# Patient Record
Sex: Male | Born: 1975 | Hispanic: Yes | Marital: Married
Health system: Southern US, Community
[De-identification: ages and names within clinical notes are randomized; demographics above are authoritative.]

## PROBLEM LIST (undated history)

## (undated) DIAGNOSIS — N200 Calculus of kidney: Secondary | ICD-10-CM

---

## 1998-12-18 ENCOUNTER — Emergency Department (HOSPITAL_COMMUNITY): Admission: EM | Admit: 1998-12-18 | Discharge: 1998-12-18 | Payer: Self-pay | Admitting: Emergency Medicine

## 2009-12-07 ENCOUNTER — Encounter: Admission: RE | Admit: 2009-12-07 | Discharge: 2009-12-07 | Payer: Self-pay | Admitting: Specialist

## 2010-08-23 ENCOUNTER — Other Ambulatory Visit (HOSPITAL_COMMUNITY): Payer: Self-pay | Admitting: Orthopedic Surgery

## 2010-08-23 DIAGNOSIS — M545 Low back pain: Secondary | ICD-10-CM

## 2010-08-24 ENCOUNTER — Ambulatory Visit: Payer: Self-pay | Attending: Orthopedic Surgery | Admitting: Physical Therapy

## 2010-08-24 DIAGNOSIS — M2569 Stiffness of other specified joint, not elsewhere classified: Secondary | ICD-10-CM | POA: Insufficient documentation

## 2010-08-24 DIAGNOSIS — IMO0001 Reserved for inherently not codable concepts without codable children: Secondary | ICD-10-CM | POA: Insufficient documentation

## 2010-08-24 DIAGNOSIS — M545 Low back pain, unspecified: Secondary | ICD-10-CM | POA: Insufficient documentation

## 2010-08-25 ENCOUNTER — Ambulatory Visit (HOSPITAL_COMMUNITY)
Admission: RE | Admit: 2010-08-25 | Discharge: 2010-08-25 | Disposition: A | Discharge status: Home / Self Care | Payer: Self-pay | Source: Ambulatory Visit | Attending: Orthopedic Surgery | Admitting: Orthopedic Surgery

## 2010-08-25 DIAGNOSIS — M5126 Other intervertebral disc displacement, lumbar region: Secondary | ICD-10-CM | POA: Insufficient documentation

## 2010-08-25 DIAGNOSIS — M545 Low back pain, unspecified: Secondary | ICD-10-CM | POA: Insufficient documentation

## 2010-08-25 DIAGNOSIS — M79609 Pain in unspecified limb: Secondary | ICD-10-CM | POA: Insufficient documentation

## 2010-08-31 ENCOUNTER — Ambulatory Visit: Payer: Self-pay | Admitting: Physical Therapy

## 2010-09-03 ENCOUNTER — Emergency Department (HOSPITAL_COMMUNITY)
Admission: EM | Admit: 2010-09-03 | Discharge: 2010-09-03 | Disposition: A | Discharge status: Home / Self Care | Payer: Self-pay | Attending: Emergency Medicine | Admitting: Emergency Medicine

## 2010-09-03 DIAGNOSIS — M545 Low back pain, unspecified: Secondary | ICD-10-CM | POA: Insufficient documentation

## 2010-09-12 ENCOUNTER — Other Ambulatory Visit (HOSPITAL_COMMUNITY): Payer: Self-pay | Admitting: Orthopedic Surgery

## 2010-09-12 ENCOUNTER — Ambulatory Visit (HOSPITAL_COMMUNITY)
Admission: RE | Admit: 2010-09-12 | Discharge: 2010-09-12 | Disposition: A | Discharge status: Home / Self Care | Payer: Self-pay | Source: Ambulatory Visit | Attending: Orthopedic Surgery | Admitting: Orthopedic Surgery

## 2010-09-12 ENCOUNTER — Encounter (HOSPITAL_COMMUNITY)
Admission: RE | Admit: 2010-09-12 | Discharge: 2010-09-12 | Disposition: A | Discharge status: Home / Self Care | Payer: Self-pay | Source: Ambulatory Visit | Attending: Orthopedic Surgery | Admitting: Orthopedic Surgery

## 2010-09-12 DIAGNOSIS — Z01818 Encounter for other preprocedural examination: Secondary | ICD-10-CM | POA: Insufficient documentation

## 2010-09-12 DIAGNOSIS — M545 Low back pain, unspecified: Secondary | ICD-10-CM | POA: Insufficient documentation

## 2010-09-12 DIAGNOSIS — Z01811 Encounter for preprocedural respiratory examination: Secondary | ICD-10-CM | POA: Insufficient documentation

## 2010-09-12 DIAGNOSIS — I10 Essential (primary) hypertension: Secondary | ICD-10-CM | POA: Insufficient documentation

## 2010-09-12 DIAGNOSIS — Z01812 Encounter for preprocedural laboratory examination: Secondary | ICD-10-CM | POA: Insufficient documentation

## 2010-09-12 LAB — CBC
HCT: 41.4 % (ref 39.0–52.0)
MCHC: 34.8 g/dL (ref 30.0–36.0)
RBC: 4.77 MIL/uL (ref 4.22–5.81)
RDW: 12.8 % (ref 11.5–15.5)
WBC: 5.8 10*3/uL (ref 4.0–10.5)

## 2010-09-12 LAB — SURGICAL PCR SCREEN: MRSA, PCR: NEGATIVE

## 2010-09-13 LAB — DIFFERENTIAL
Basophils Relative: 1 % (ref 0–1)
Eosinophils Absolute: 0.4 10*3/uL (ref 0.0–0.7)
Lymphs Abs: 1.6 10*3/uL (ref 0.7–4.0)
Monocytes Absolute: 0.5 10*3/uL (ref 0.1–1.0)
Monocytes Relative: 9 % (ref 3–12)
Neutrophils Relative %: 53 % (ref 43–77)

## 2010-09-13 LAB — COMPREHENSIVE METABOLIC PANEL
Albumin: 4.3 g/dL (ref 3.5–5.2)
Calcium: 10.1 mg/dL (ref 8.4–10.5)
Creatinine, Ser: 0.66 mg/dL (ref 0.4–1.5)
GFR calc non Af Amer: >60 mL/min (ref >60)
Total Bilirubin: 0.2 mg/dL — ABNORMAL LOW (ref 0.3–1.2)

## 2010-09-14 ENCOUNTER — Ambulatory Visit (HOSPITAL_COMMUNITY)
Admission: RE | Admit: 2010-09-14 | Discharge: 2010-09-14 | Disposition: A | Discharge status: Home / Self Care | Payer: Self-pay | Source: Ambulatory Visit | Attending: Orthopedic Surgery | Admitting: Orthopedic Surgery

## 2010-09-14 ENCOUNTER — Ambulatory Visit (HOSPITAL_COMMUNITY): Payer: Self-pay

## 2010-09-14 DIAGNOSIS — Z01812 Encounter for preprocedural laboratory examination: Secondary | ICD-10-CM | POA: Insufficient documentation

## 2010-09-14 DIAGNOSIS — Z01818 Encounter for other preprocedural examination: Secondary | ICD-10-CM | POA: Insufficient documentation

## 2010-09-14 DIAGNOSIS — M5126 Other intervertebral disc displacement, lumbar region: Secondary | ICD-10-CM | POA: Insufficient documentation

## 2010-09-14 DIAGNOSIS — Z01810 Encounter for preprocedural cardiovascular examination: Secondary | ICD-10-CM | POA: Insufficient documentation

## 2010-09-14 LAB — URINALYSIS, ROUTINE W REFLEX MICROSCOPIC
Bilirubin Urine: NEGATIVE
Hgb urine dipstick: NEGATIVE
Ketones, ur: NEGATIVE mg/dL
Nitrite: NEGATIVE
Urobilinogen, UA: 0.2 mg/dL (ref 0.0–1.0)

## 2010-09-14 LAB — TYPE AND SCREEN
ABO/RH(D): O POS
Antibody Screen: NEGATIVE

## 2010-09-14 LAB — ABO/RH: ABO/RH(D): O POS

## 2010-09-14 LAB — PROTIME-INR
INR: 0.97 (ref 0.00–1.49)
Prothrombin Time: 13.1 seconds (ref 11.6–15.2)

## 2010-09-15 NOTE — Op Note (Signed)
Noah Tran, Noah Tran        ACCOUNT NO.:  [id number]  MEDICAL RECORD NO.:  [id number]           PATIENT TYPE:  O  LOCATION:  SDSC                         FACILITY:  [facility]  PHYSICIAN:  Estill Bamberg, MD      DATE OF BIRTH:  07-16-1975  DATE OF PROCEDURE:  09/14/2010 DATE OF DISCHARGE:  09/14/2010                              OPERATIVE REPORT   PREOPERATIVE DIAGNOSES: 1. Left-sided S1 radiculopathy. 2. Left-sided L5-S1 disk herniation.  POSTOPERATIVE DIAGNOSES: 1. Left-sided S1 radiculopathy. 2. Left-sided L5-S1 disk herniation.  PROCEDURE:  Left-sided L5-S1 microdiskectomy.  SURGEON:  Estill Bamberg, MD  ASSISTANT:  None.  ANESTHESIA:  General endotracheal anesthesia.  COMPLICATIONS:  None.  DISPOSITION:  Stable.  INDICATIONS FOR PROCEDURE:  Briefly, Noah Tran is a pleasant 35- year-old male who presented to my office initially on August 19, 2010, with severe debilitating pain in the left leg.  I did ultimately obtain an MRI which was consistent with a large L5-S1 disk herniation causing obvious compression of the traversing S1 nerve.  I therefore had discussion regarding going forward with a L5-S1 microdiskectomy.  The patient did understand the risks and limitations of the procedure as outlined in my preoperative note.  OPERATIVE DETAILS:  On September 14, 2010, the patient brought to surgery and general endotracheal anesthesia was administered.  The patient was given Ancef for antibiotic prophylaxis.  The patient was placed prone on a well-padded Jackson spinal frame.  Antibiotics were given and a time-out procedure was performed.  The back was prepped and draped in the usual sterile fashion and then I did place two 18-gauge spinal needles over the midline to help optimize the location of my incision.  A lateral radiograph was obtained.  I then made a 1-inch incision overlying the L5- S1 interspace.  I then used electrocautery dissection that carried  me through the subcutaneous tissue and I then made a curvilinear incision on the left side of the fascia.  The paraspinal musculature was swept laterally.  I then obtained a second intraoperative lateral radiograph to confirm the appropriate level.  I then subperiosteally exposed the lamina of L5 and S1 and a laminotomy was performed in the usual manner. The ligamentum flavum was removed.  I readily identified the dura and the traversing S1 nerve.  The traversing S1 nerve was swept medially and a large underlying L5-S1 disk herniation was readily noted.  I did use a Penfield four to sweep where the superficial layers overlying the disk herniation and the herniated fragment did readily to clear itself.  It was removed using a pituitary in one large piece.  After the disk was removed, there was noted to be no compression of the traversing S1 nerve.  There was no extravasation of cerebrospinal fluid noted throughout the procedure.  All of the dural bleeding was meticulously controlled with the termination of the procedure using bipolar electrocautery and Gelfoam.  The wound was then copiously irrigated. Depo-Medrol 40 mg was infiltrated about the S1 nerve.  The fascia was then closed using #1 Vicryl.  The subcutaneous layer was closed using 2- 0 Vicryl and skin was closed using 3-0 Monocryl.  Steri-Strips and  Benzoin were applied.  The patient was awoken from general endotracheal anesthesia and transferred to recovery in stable condition.  Of note, all instrument counts were correct at the termination of the procedure.     Estill Bamberg, MD     MD/MEDQ  D:  09/14/2010  T:  09/15/2010  Job:  [id number]  cc:   Dr. Quintella Reichert  Electronically Signed by Loraine Leriche [place] Petion  on 09/15/2010 04:39:34 PM

## 2010-10-12 ENCOUNTER — Ambulatory Visit: Payer: Self-pay | Admitting: Physical Therapy

## 2010-10-12 ENCOUNTER — Ambulatory Visit: Payer: Self-pay | Attending: Orthopedic Surgery | Admitting: Physical Therapy

## 2010-10-12 DIAGNOSIS — M545 Low back pain, unspecified: Secondary | ICD-10-CM | POA: Insufficient documentation

## 2010-10-12 DIAGNOSIS — IMO0001 Reserved for inherently not codable concepts without codable children: Secondary | ICD-10-CM | POA: Insufficient documentation

## 2010-10-12 DIAGNOSIS — M2569 Stiffness of other specified joint, not elsewhere classified: Secondary | ICD-10-CM | POA: Insufficient documentation

## 2010-10-14 ENCOUNTER — Ambulatory Visit: Payer: Self-pay | Admitting: Physical Therapy

## 2010-10-18 ENCOUNTER — Ambulatory Visit: Payer: Self-pay | Admitting: Physical Therapy

## 2010-10-20 ENCOUNTER — Ambulatory Visit: Payer: Self-pay | Admitting: Physical Therapy

## 2010-10-26 ENCOUNTER — Ambulatory Visit: Payer: Self-pay | Admitting: Physical Therapy

## 2010-10-28 ENCOUNTER — Ambulatory Visit: Payer: Self-pay | Admitting: Physical Therapy

## 2010-10-31 ENCOUNTER — Ambulatory Visit: Payer: Self-pay | Admitting: Physical Therapy

## 2010-11-30 ENCOUNTER — Ambulatory Visit: Payer: Self-pay | Attending: Orthopedic Surgery | Admitting: Physical Therapy

## 2010-11-30 DIAGNOSIS — M2569 Stiffness of other specified joint, not elsewhere classified: Secondary | ICD-10-CM | POA: Insufficient documentation

## 2010-11-30 DIAGNOSIS — M545 Low back pain, unspecified: Secondary | ICD-10-CM | POA: Insufficient documentation

## 2010-11-30 DIAGNOSIS — IMO0001 Reserved for inherently not codable concepts without codable children: Secondary | ICD-10-CM | POA: Insufficient documentation

## 2010-12-05 ENCOUNTER — Ambulatory Visit: Payer: Self-pay | Admitting: Physical Therapy

## 2010-12-09 ENCOUNTER — Ambulatory Visit: Payer: Self-pay | Admitting: Physical Therapy

## 2010-12-13 ENCOUNTER — Ambulatory Visit: Payer: Self-pay | Attending: Orthopedic Surgery | Admitting: Physical Therapy

## 2010-12-13 DIAGNOSIS — M2569 Stiffness of other specified joint, not elsewhere classified: Secondary | ICD-10-CM | POA: Insufficient documentation

## 2010-12-13 DIAGNOSIS — M545 Low back pain, unspecified: Secondary | ICD-10-CM | POA: Insufficient documentation

## 2010-12-13 DIAGNOSIS — IMO0001 Reserved for inherently not codable concepts without codable children: Secondary | ICD-10-CM | POA: Insufficient documentation

## 2010-12-19 ENCOUNTER — Ambulatory Visit: Payer: Self-pay | Admitting: Physical Therapy

## 2010-12-23 ENCOUNTER — Ambulatory Visit: Payer: Self-pay | Admitting: Physical Therapy

## 2010-12-28 ENCOUNTER — Ambulatory Visit: Payer: Self-pay | Admitting: Physical Therapy

## 2011-11-30 IMAGING — CR DG CHEST 2V
2 series · 2 of 2 positions shown · non-contrast
Comparison: None.

CLINICAL DATA: Preoperative respiratory exam; hypertension, low
back pain for discectomy

CHEST - 2 VIEW

[view not recorded (1 of 2)]
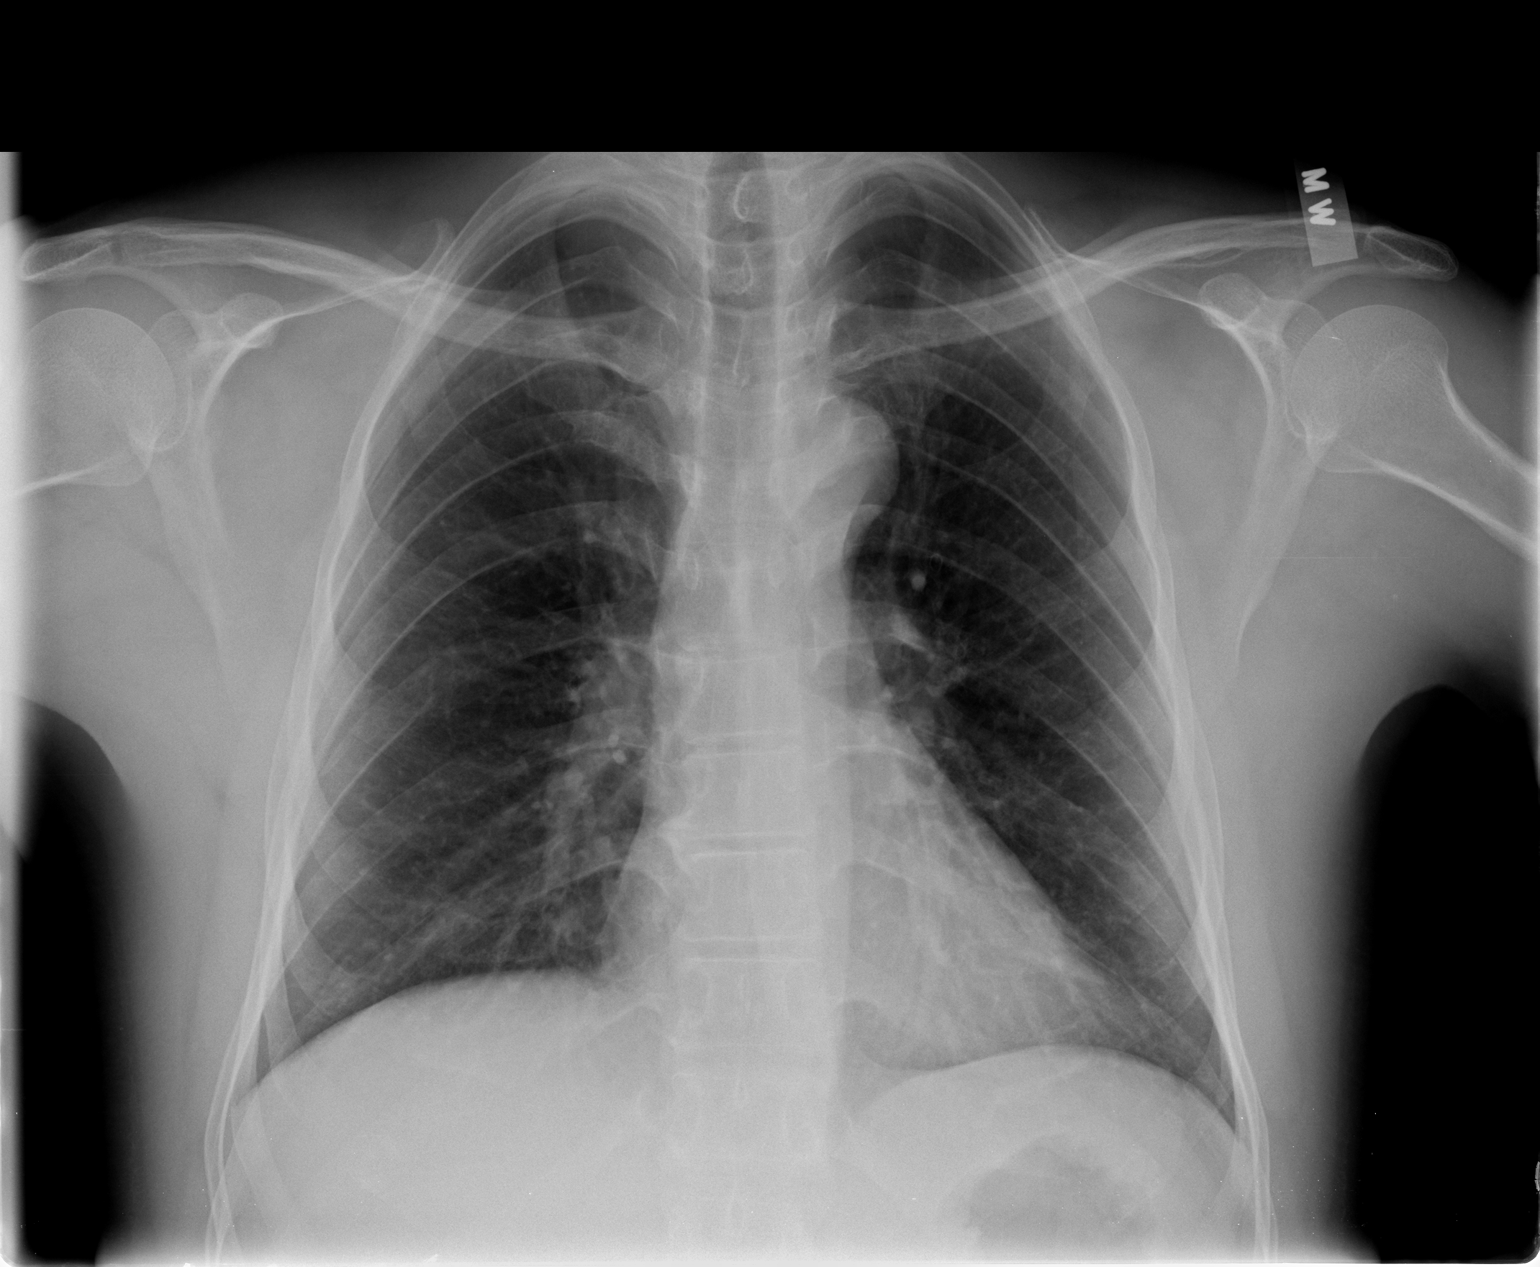

[view not recorded (2 of 2)]
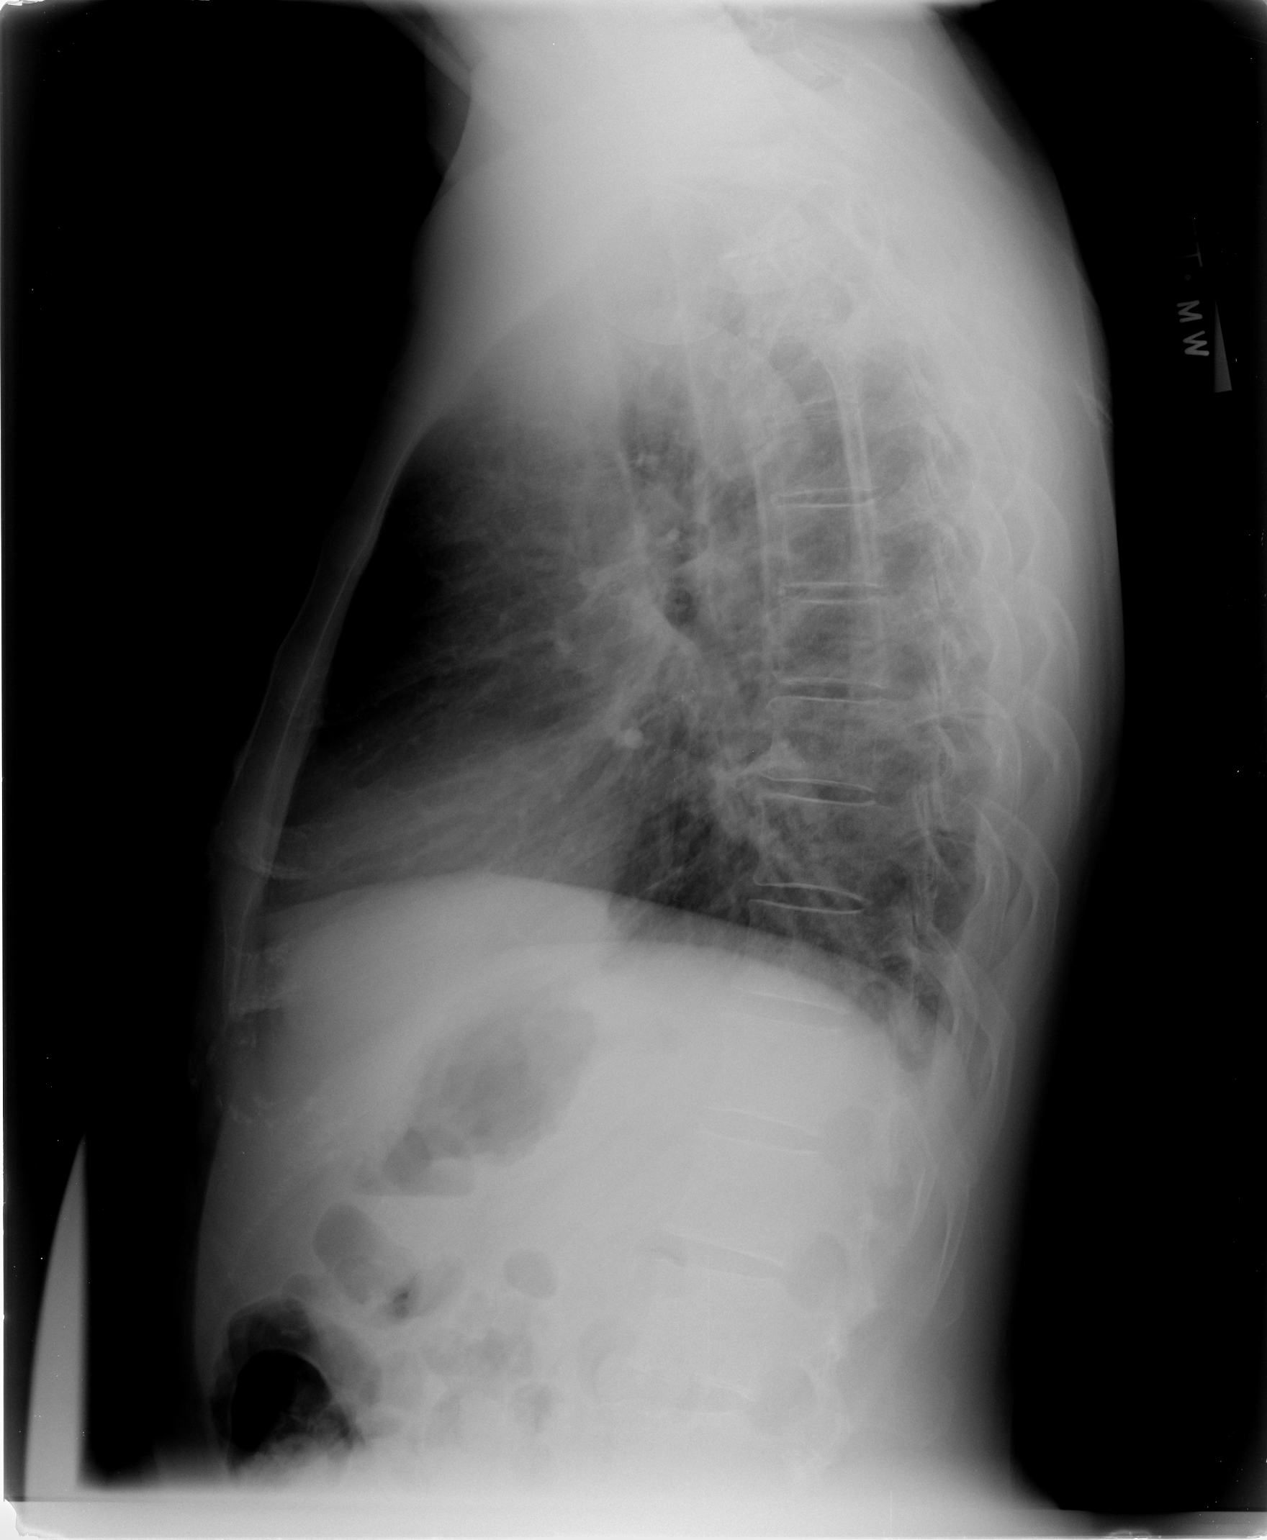

[2 of 2 positions shown; findings below may reference images not displayed]

FINDINGS: The cardiac silhouette, mediastinum, pulmonary
vasculature are within normal limits.  Both lungs are clear.
There is no acute bony abnormality.
IMPRESSION: There is no evidence of acute cardiac or pulmonary process.

## 2011-12-02 IMAGING — CR DG LUMBAR SPINE 2-3V
1 series · 1 of 1 positions shown · non-contrast
Comparison: MRI lumbar spine 07/27/2010

CLINICAL DATA: Low back pain

LUMBAR SPINE - 2-3 VIEW

[view not recorded]
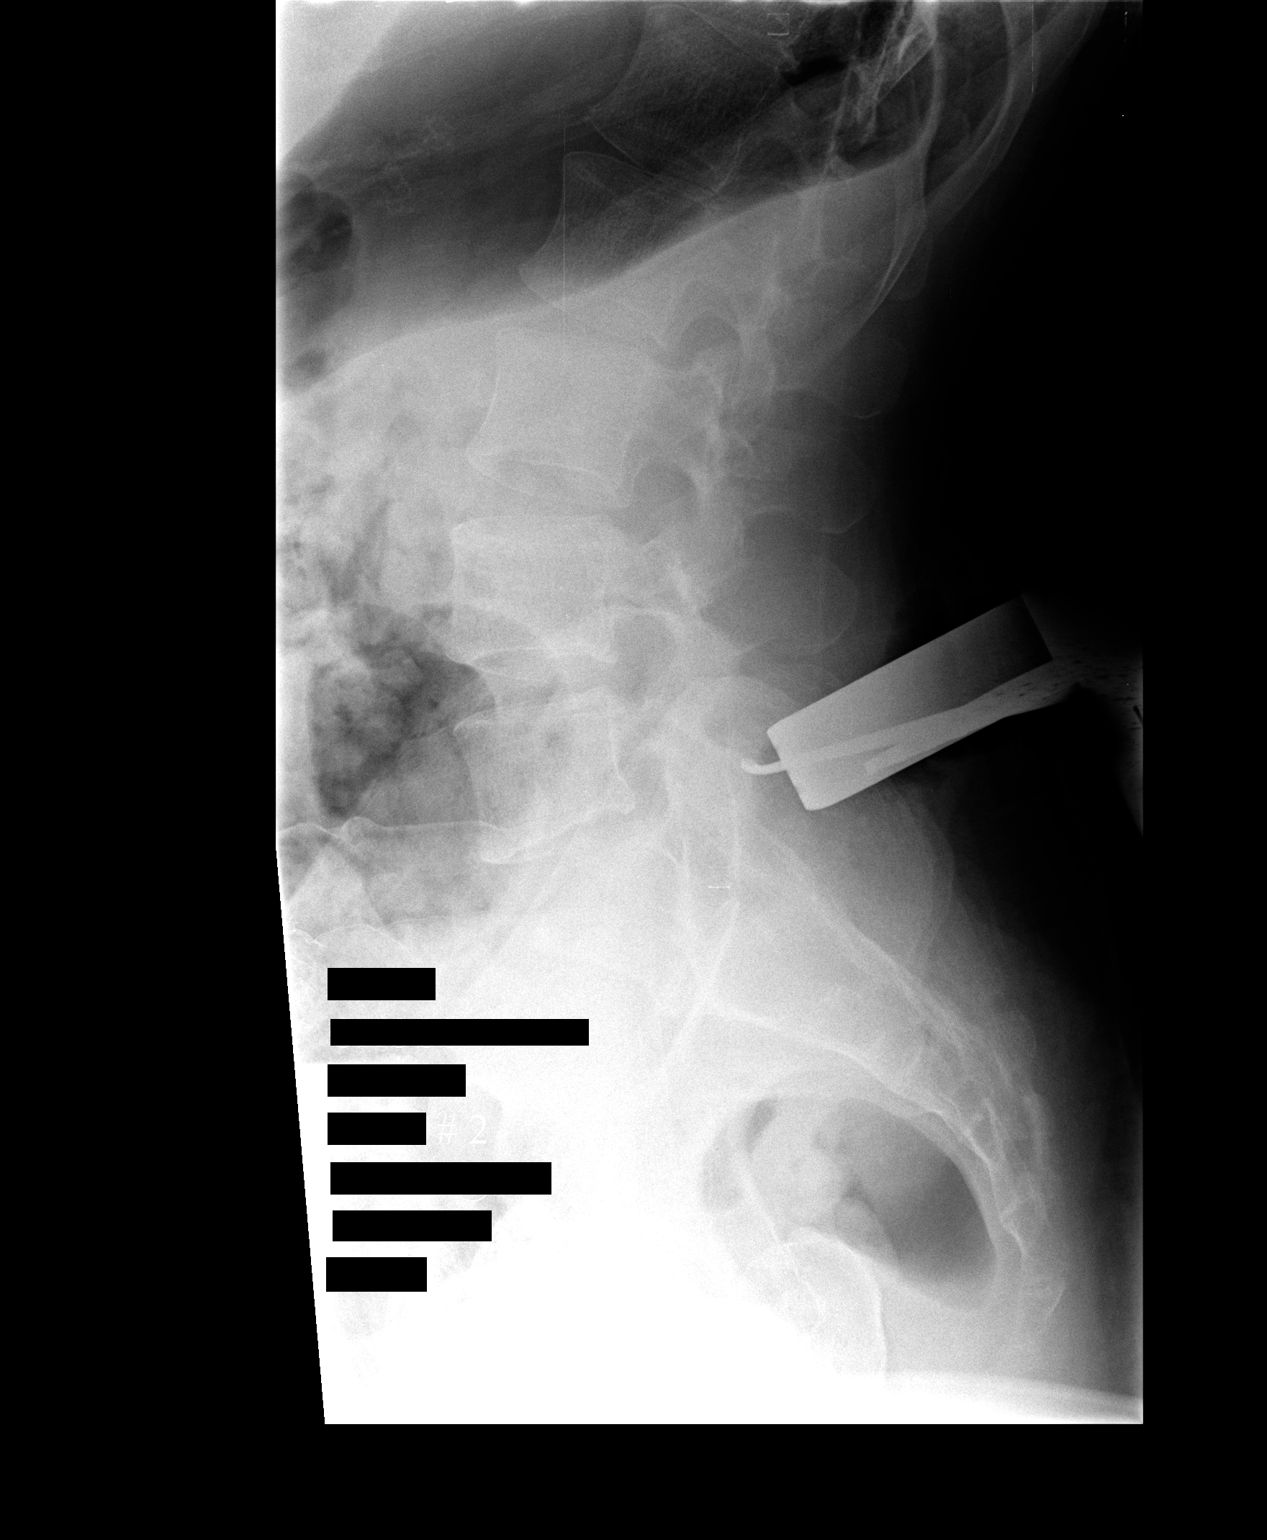

[1 of 1 positions shown; findings below may reference images not displayed]

FINDINGS: Intraoperative film #1 at 4771 hours demonstrates needles
directed most closely toward L5 and S1. Film #2 at 5255 hours
demonstrates a blunt probe directed most closely toward the L5-S1
interspace.
IMPRESSION: As above.

## 2011-12-15 ENCOUNTER — Emergency Department (HOSPITAL_COMMUNITY): Payer: Self-pay

## 2011-12-15 ENCOUNTER — Emergency Department (HOSPITAL_COMMUNITY)
Admission: EM | Admit: 2011-12-15 | Discharge: 2011-12-15 | Disposition: A | Discharge status: Home / Self Care | Payer: Self-pay | Attending: Emergency Medicine | Admitting: Emergency Medicine

## 2011-12-15 ENCOUNTER — Encounter (HOSPITAL_COMMUNITY): Payer: Self-pay | Admitting: Emergency Medicine

## 2011-12-15 DIAGNOSIS — S8990XA Unspecified injury of unspecified lower leg, initial encounter: Secondary | ICD-10-CM | POA: Insufficient documentation

## 2011-12-15 DIAGNOSIS — W64XXXA Exposure to other animate mechanical forces, initial encounter: Secondary | ICD-10-CM | POA: Insufficient documentation

## 2011-12-15 DIAGNOSIS — Y9279 Other farm location as the place of occurrence of the external cause: Secondary | ICD-10-CM | POA: Insufficient documentation

## 2011-12-15 DIAGNOSIS — S8991XA Unspecified injury of right lower leg, initial encounter: Secondary | ICD-10-CM

## 2011-12-15 MED ORDER — IBUPROFEN 800 MG PO TABS
800.0000 mg | ORAL_TABLET | Freq: Once | ORAL | Status: AC
Start: 2011-12-15 — End: 2011-12-15
  Administered 2011-12-15: 800 mg via ORAL
  Filled 2011-12-15: qty 1

## 2011-12-15 MED ORDER — HYDROCODONE-ACETAMINOPHEN 5-325 MG PO TABS
1.0000 | ORAL_TABLET | Freq: Once | ORAL | Status: AC
  Administered 2011-12-15: 1 via ORAL
  Filled 2011-12-15: qty 1

## 2011-12-15 MED ORDER — IBUPROFEN 800 MG PO TABS: 800.0000 mg | ORAL_TABLET | Freq: Three times a day (TID) | ORAL | Status: AC

## 2011-12-15 MED ORDER — HYDROCODONE-ACETAMINOPHEN 5-500 MG PO TABS: 1.0000 | ORAL_TABLET | Freq: Four times a day (QID) | ORAL | Status: AC | PRN

## 2011-12-15 NOTE — ED Notes (Signed)
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

## 2011-12-15 NOTE — ED Provider Notes (Signed)
History     CSN: [id number]  Arrival date & time 12/15/11  0318   First MD Initiated Contact with Patient 12/15/11 [phone]      Chief Complaint  Patient presents with  . 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 Injury to R knee. No twisting or bending injury. No weakness. Feels numb over his knee, no distal numbness. Unable to walk, states it feels like it is going to give out. Pain is sharp and not radiating, MOD in severity. Continuous since onset PTA tonight.  No past medical history on file.  No past surgical history on file.  No family history on file.  History  Substance Use Topics  . Smoking status: Not on file  . Smokeless tobacco: Not on file  . Alcohol Use: Not on file      Review of Systems  Constitutional: Negative for fever and chills.  HENT: Negative for neck pain and neck stiffness.   Eyes: Negative for pain.  Respiratory: Negative for shortness of breath.   Cardiovascular: Negative for chest pain.  Gastrointestinal: Negative for abdominal pain.  Genitourinary: Negative for dysuria.  Musculoskeletal: Positive for joint swelling. Negative for back pain.  Skin: Negative for rash and wound.  Neurological: Negative for headaches.  All other systems reviewed and are negative.    Allergies  Review of patient's allergies indicates no known allergies.  Home Medications  No current outpatient prescriptions on file.  BP 114/75  Pulse 62  Temp 98.7 F (37.1 C) (Oral)  Resp 18  SpO2 98%  Physical Exam  Constitutional: He is oriented to person, place, and time. He appears well-developed and well-nourished.  HENT:  Head: Normocephalic and atraumatic.  Eyes: Conjunctivae and EOM are normal. Pupils are equal, round, and reactive to light.  Neck: Trachea normal. Neck supple. No thyromegaly present.    Cardiovascular: Normal rate, regular rhythm, S1 normal, S2 normal and normal pulses.     No systolic murmur is present   No diastolic murmur is present  Pulses:      Radial pulses are 2+ on the right side, and 2+ on the left side.  Pulmonary/Chest: Effort normal and breath sounds normal. He has no wheezes. He has no rhonchi. He has no rales. He exhibits no tenderness.  Abdominal: Soft. Normal appearance and bowel sounds are normal. There is no tenderness. There is no CVA tenderness and negative Murphy's sign.  Musculoskeletal:       RLE: TTP over patella and medial knee with moderate swelling, skin intact, sensorium to light touch intact throughout, distal n/v intact, no obvious deformity, limited ROM at knee 2/2 pain. NT hip, ankle, foot.   Neurological: He is alert and oriented to person, place, and time. He has normal strength. No cranial nerve deficit or sensory deficit. GCS eye subscore is 4. GCS verbal subscore is 5. GCS motor subscore is 6.  Skin: Skin is warm and dry. No rash noted. He is not diaphoretic.  Psychiatric: His speech is normal.       Cooperative and appropriate    ED Course  Procedures (including critical care time)  Dg Knee Complete 4 Views Right  12/15/2011  *RADIOLOGY REPORT*  Clinical Data: Medial right knee pain.  RIGHT KNEE - COMPLETE 4+ VIEW  Comparison: None.  Findings: No displaced fracture or dislocation.  No aggressive osseous lesion.  No definite joint effusion.  IMPRESSION:  No acute osseous abnormality. If clinical concern for a fracture persists, recommend a repeat radiograph in 5-10 days to evaluate for interval change or callus formation.  Original Report Authenticated By: Waneta Martins, M.D.    MDM   R knee injury.   Ice. Motrin, hydrocodone. Xray obtained/ reviewed as above. Unable to bear wt. Plan immob and crutches. PT has seen Dr Yevette Edwards in the past and wishes to follow up with him again. Rx and referal provided.         Sunnie Nielsen,  MD 12/15/11 [phone]

## 2011-12-15 NOTE — Progress Notes (Signed)
Orthopedic Tech Progress Note Patient Details:  Noah Tran 1975/08/28 [id number]  Ortho Devices Type of Ortho Device: Knee Immobilizer;Crutches Ortho Device/Splint Location: Right knee Ortho Device/Splint Interventions: Application   Asia R Thompson 12/15/2011, 4:56 AM

## 2013-11-17 ENCOUNTER — Other Ambulatory Visit (HOSPITAL_COMMUNITY): Payer: Self-pay | Admitting: Urology

## 2013-11-17 DIAGNOSIS — R102 Pelvic and perineal pain: Secondary | ICD-10-CM

## 2013-11-27 ENCOUNTER — Ambulatory Visit (HOSPITAL_COMMUNITY)
Admission: RE | Admit: 2013-11-27 | Discharge: 2013-11-27 | Disposition: A | Discharge status: Home / Self Care | Payer: No Typology Code available for payment source | Source: Ambulatory Visit | Attending: Urology | Admitting: Urology

## 2013-11-27 ENCOUNTER — Encounter (HOSPITAL_COMMUNITY): Payer: Self-pay

## 2013-11-27 DIAGNOSIS — R1032 Left lower quadrant pain: Secondary | ICD-10-CM | POA: Insufficient documentation

## 2013-11-27 DIAGNOSIS — R102 Pelvic and perineal pain: Secondary | ICD-10-CM

## 2013-11-27 DIAGNOSIS — N281 Cyst of kidney, acquired: Secondary | ICD-10-CM | POA: Insufficient documentation

## 2013-11-27 HISTORY — DX: Calculus of kidney: N20.0

## 2013-11-27 MED ORDER — IOHEXOL 300 MG/ML  SOLN
50.0000 mL | Freq: Once | INTRAMUSCULAR | Status: AC | PRN
  Administered 2013-11-27: 50 mL via ORAL

## 2013-11-27 MED ORDER — IOHEXOL 300 MG/ML  SOLN
100.0000 mL | Freq: Once | INTRAMUSCULAR | Status: AC | PRN
  Administered 2013-11-27: 100 mL via INTRAVENOUS

## 2014-03-12 ENCOUNTER — Encounter: Payer: Self-pay | Admitting: Internal Medicine

## 2014-04-16 ENCOUNTER — Other Ambulatory Visit: Payer: Self-pay | Admitting: Gastroenterology

## 2014-04-16 DIAGNOSIS — R1032 Left lower quadrant pain: Secondary | ICD-10-CM

## 2014-04-23 ENCOUNTER — Ambulatory Visit
Admission: RE | Admit: 2014-04-23 | Discharge: 2014-04-23 | Disposition: A | Discharge status: Home / Self Care | Payer: No Typology Code available for payment source | Source: Ambulatory Visit | Attending: Gastroenterology | Admitting: Gastroenterology

## 2014-04-23 DIAGNOSIS — R1032 Left lower quadrant pain: Secondary | ICD-10-CM

## 2014-04-23 MED ORDER — IOHEXOL 300 MG/ML  SOLN
100.0000 mL | Freq: Once | INTRAMUSCULAR | Status: AC | PRN
  Administered 2014-04-23: 100 mL via INTRAVENOUS

## 2014-05-14 ENCOUNTER — Ambulatory Visit: Payer: Self-pay | Admitting: Internal Medicine

## 2023-08-29 ENCOUNTER — Other Ambulatory Visit (HOSPITAL_BASED_OUTPATIENT_CLINIC_OR_DEPARTMENT_OTHER): Payer: Self-pay | Admitting: Family Medicine

## 2023-08-29 DIAGNOSIS — R590 Localized enlarged lymph nodes: Secondary | ICD-10-CM

## 2023-09-14 ENCOUNTER — Ambulatory Visit (INDEPENDENT_AMBULATORY_CARE_PROVIDER_SITE_OTHER)
Admission: RE | Admit: 2023-09-14 | Discharge: 2023-09-14 | Disposition: A | Discharge status: Home / Self Care | Payer: Self-pay | Source: Ambulatory Visit | Attending: Family Medicine | Admitting: Family Medicine

## 2023-09-14 DIAGNOSIS — R590 Localized enlarged lymph nodes: Secondary | ICD-10-CM
